# Patient Record
Sex: Male | Born: 1985 | Race: White | Hispanic: No | Marital: Married | Smoking: Never smoker
Health system: Southern US, Community
[De-identification: ages and names within clinical notes are randomized; demographics above are authoritative.]

---

## 2018-04-17 ENCOUNTER — Other Ambulatory Visit: Payer: Self-pay

## 2018-04-17 ENCOUNTER — Emergency Department (INDEPENDENT_AMBULATORY_CARE_PROVIDER_SITE_OTHER)
Admission: EM | Admit: 2018-04-17 | Discharge: 2018-04-17 | Disposition: A | Discharge status: Home / Self Care | Payer: 59 | Source: Home / Self Care

## 2018-04-17 ENCOUNTER — Encounter: Payer: Self-pay | Admitting: Emergency Medicine

## 2018-04-17 DIAGNOSIS — R69 Illness, unspecified: Secondary | ICD-10-CM

## 2018-04-17 DIAGNOSIS — J111 Influenza due to unidentified influenza virus with other respiratory manifestations: Secondary | ICD-10-CM

## 2018-04-17 MED ORDER — OSELTAMIVIR PHOSPHATE 75 MG PO CAPS: 75.0000 mg | ORAL_CAPSULE | Freq: Two times a day (BID) | ORAL | 0 refills | Status: AC

## 2018-04-17 NOTE — ED Triage Notes (Signed)
Here with flu like sx's- cough, fever, headache x2 days. Temp 100.5 last dose Tylenol @ 10am.

## 2018-04-17 NOTE — ED Provider Notes (Signed)
[facility]    CSN: [id number] Arrival date & time: 04/17/18  1531     History   Chief Complaint Chief Complaint  Patient presents with  . Cough  . Fever    HPI William Krause is a 33 y.o. male.   HPI  Joao Kingman is a 33 y.o. male presenting to UC with c/o sudden onset body aches, chills, cough, congestion and fever Tmax 101*F last night.  He took Tylenol at 10AM this morning. Others at home have also been sick. He did not receive the flu vaccine. Denies chest pain or SOB. No n/v/d.     History reviewed. No pertinent past medical history.  There are no active problems to display for this patient.   History reviewed. No pertinent surgical history.     Home Medications    Prior to Admission medications   Medication Sig Start Date End Date Taking? Authorizing Provider  oseltamivir (TAMIFLU) 75 MG capsule Take 1 capsule (75 mg total) by mouth every 12 (twelve) hours. 04/17/18   Lurene Shadow, PA-C    Family History History reviewed. No pertinent family history.  Social History Social History   Tobacco Use  . Smoking status: Never Smoker  . Smokeless tobacco: Never Used  Substance Use Topics  . Alcohol use: Yes  . Drug use: Never     Allergies   Patient has no known allergies.   Review of Systems Review of Systems  Constitutional: Positive for chills, fatigue and fever.  HENT: Positive for congestion. Negative for ear pain, sore throat, trouble swallowing and voice change.   Respiratory: Positive for cough. Negative for shortness of breath.   Cardiovascular: Negative for chest pain and palpitations.  Gastrointestinal: Negative for abdominal pain, diarrhea, nausea and vomiting.  Musculoskeletal: Positive for arthralgias, back pain and myalgias.  Skin: Negative for rash.  Neurological: Positive for headaches. Negative for dizziness and light-headedness.     Physical Exam Triage Vital Signs ED Triage Vitals  Enc Vitals Group   BP 04/17/18 1643 137/76     Pulse Rate 04/17/18 1643 (!) 113     Resp --      Temp 04/17/18 1643 (!) 100.5 F (38.1 C)     Temp Source 04/17/18 1643 Oral     SpO2 04/17/18 1643 97 %     Weight 04/17/18 1644 241 lb 6.4 oz (109.5 kg)     Height 04/17/18 1644 5\' 10"  (1.778 m)     Head Circumference --      Peak Flow --      Pain Score 04/17/18 1644 6     Pain Loc --      Pain Edu? --      Excl. in GC? --    No data found.  Updated Vital Signs BP 137/76 (BP Location: Left Arm)   Pulse (!) 113   Temp (!) 100.5 F (38.1 C) (Oral)   Ht 5\' 10"  (1.778 m)   Wt 241 lb 6.4 oz (109.5 kg)   SpO2 97%   BMI 34.64 kg/m   Visual Acuity Right Eye Distance:   Left Eye Distance:   Bilateral Distance:    Right Eye Near:   Left Eye Near:    Bilateral Near:     Physical Exam Vitals signs and nursing note reviewed.  Constitutional:      Appearance: Normal appearance. He is well-developed.  HENT:     Head: Normocephalic and atraumatic.     Right Ear: Tympanic membrane  normal.     Left Ear: Tympanic membrane normal.     Nose: Nose normal.     Mouth/Throat:     Lips: Pink.     Mouth: Mucous membranes are moist.     Pharynx: Oropharynx is clear. Uvula midline.  Neck:     Musculoskeletal: Normal range of motion.  Cardiovascular:     Rate and Rhythm: Normal rate and regular rhythm.  Pulmonary:     Effort: Pulmonary effort is normal.     Breath sounds: Normal breath sounds. No stridor. No wheezing or rhonchi.  Musculoskeletal: Normal range of motion.  Skin:    General: Skin is warm and dry.  Neurological:     Mental Status: He is alert and oriented to person, place, and time.  Psychiatric:        Behavior: Behavior normal.      UC Treatments / Results  Labs (all labs ordered are listed, but only abnormal results are displayed) Labs Reviewed - No data to display  EKG None  Radiology No results found.  Procedures Procedures (including critical care time)  Medications  Ordered in UC Medications - No data to display  Initial Impression / Assessment and Plan / UC Course  I have reviewed the triage vital signs and the nursing notes.  Pertinent labs & imaging results that were available during my care of the patient were reviewed by me and considered in my medical decision making (see chart for details).     Hx and exam c/w influenza Given active flu season, will tx empirically for influenza Home care info provided  Final Clinical Impressions(s) / UC Diagnoses   Final diagnoses:  Influenza-like illness     Discharge Instructions      You may take 500mg  acetaminophen every 4-6 hours or in combination with ibuprofen 400-600mg  every 6-8 hours as needed for pain, inflammation, and fever.  Be sure to well hydrated with clear liquids and get at least 8 hours of sleep at night, preferably more while sick.   Please follow up with family medicine in 1 week if needed.     ED Prescriptions    Medication Sig Dispense Auth. Provider   oseltamivir (TAMIFLU) 75 MG capsule Take 1 capsule (75 mg total) by mouth every 12 (twelve) hours. 10 capsule Lurene Shadow, PA-C     Controlled Substance Prescriptions [place] Controlled Substance Registry consulted? Not Applicable   Rolla Plate 04/17/18 1706

## 2018-04-17 NOTE — Discharge Instructions (Signed)
  You may take 500mg acetaminophen every 4-6 hours or in combination with ibuprofen 400-600mg every 6-8 hours as needed for pain, inflammation, and fever.  Be sure to well hydrated with clear liquids and get at least 8 hours of sleep at night, preferably more while sick.   Please follow up with family medicine in 1 week if needed.   

## 2018-06-02 ENCOUNTER — Emergency Department (INDEPENDENT_AMBULATORY_CARE_PROVIDER_SITE_OTHER): Payer: 59

## 2018-06-02 ENCOUNTER — Emergency Department (INDEPENDENT_AMBULATORY_CARE_PROVIDER_SITE_OTHER)
Admission: EM | Admit: 2018-06-02 | Discharge: 2018-06-02 | Disposition: A | Discharge status: Home / Self Care | Payer: 59 | Source: Home / Self Care

## 2018-06-02 ENCOUNTER — Other Ambulatory Visit: Payer: Self-pay

## 2018-06-02 DIAGNOSIS — R05 Cough: Secondary | ICD-10-CM | POA: Diagnosis not present

## 2018-06-02 DIAGNOSIS — R059 Cough, unspecified: Secondary | ICD-10-CM

## 2018-06-02 MED ORDER — CETIRIZINE HCL 10 MG PO TABS: 10.0000 mg | ORAL_TABLET | Freq: Every day | ORAL | 0 refills | Status: AC

## 2018-06-02 MED ORDER — FLUTICASONE PROPIONATE 50 MCG/ACT NA SUSP: 2.0000 | Freq: Every day | NASAL | 2 refills | Status: AC

## 2018-06-02 NOTE — Discharge Instructions (Signed)
°  Please start using the allergy medication today and allow up to 2 weeks before you may see full results, especially with the nasal spray.  If still having cough in 1-2 weeks, please follow up with your family provider for recheck of symptoms.  If you develop chest tightness, difficulty breathing, vomiting, fever over 100.4*F or other new concerning symptoms, please be reevaluated sooner.

## 2018-06-02 NOTE — ED Provider Notes (Signed)
[facility]    CSN: [id number] Arrival date & time: 06/02/18  1010     History   Chief Complaint Chief Complaint  Patient presents with  . Cough  . Fever    HPI William Krause is a 33 y.o. male.   HPI  William Krause is a 33 y.o. male presenting to UC with c/o intermittent dry cough since being dx with the flu end of January. He was tx with tamiflu. Cough worse over the last 3 days. Worse after coming back from lunch and going to work.  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 Denies fever, chills, chest pain or SOB.    History reviewed. No pertinent past medical history.  There are no active problems to display for this patient.   History reviewed. No pertinent surgical history.     Home Medications    Prior to Admission medications   Medication Sig Start Date End Date Taking? Authorizing Provider  cetirizine (ZYRTEC) 10 MG tablet Take 1 tablet (10 mg total) by mouth daily. 06/02/18   Lurene Shadow, PA-C  fluticasone (FLONASE) 50 MCG/ACT nasal spray Place 2 sprays into both nostrils daily. 06/02/18   Lurene Shadow, PA-C  oseltamivir (TAMIFLU) 75 MG capsule Take 1 capsule (75 mg total) by mouth every 12 (twelve) hours. 04/17/18   Lurene Shadow, PA-C    Family History History reviewed. No pertinent family history.  Social History Social History   Tobacco Use  . Smoking status: Never Smoker  . Smokeless tobacco: Never Used  Substance Use Topics  . Alcohol use: Yes  . Drug use: Never     Allergies   Patient has no known allergies.   Review of Systems Review of Systems  Constitutional: Negative for chills and fever.  HENT: Positive for congestion and postnasal drip. Negative for ear pain, sore throat, trouble swallowing and voice change.   Respiratory: Positive for cough. Negative for shortness of breath.   Cardiovascular: Negative for chest  pain and palpitations.  Gastrointestinal: Negative for abdominal pain, diarrhea, nausea and vomiting.  Musculoskeletal: Negative for arthralgias, back pain and myalgias.  Skin: Negative for rash.     Physical Exam Triage Vital Signs ED Triage Vitals [06/02/18 1029]  Enc Vitals Group     BP 131/88     Pulse Rate 88     Resp 18     Temp 98.4 F (36.9 C)     Temp Source Oral     SpO2 96 %     Weight      Height      Head Circumference      Peak Flow      Pain Score      Pain Loc      Pain Edu?      Excl. in GC?    No data found.  Updated Vital Signs BP 131/88 (BP Location: Right Arm)   Pulse 88   Temp 98.4 F (36.9 C) (Oral)   Resp 18   Ht 5\' 10"  (1.778 m)   Wt 235 lb (106.6 kg)   SpO2 96%   BMI 33.72 kg/m   Visual Acuity Right Eye Distance:   Left Eye Distance:   Bilateral Distance:    Right Eye Near:   Left Eye Near:    Bilateral Near:     Physical Exam  Vitals signs and nursing note reviewed.  Constitutional:      Appearance: Normal appearance. He is well-developed.  HENT:     Head: Normocephalic and atraumatic.     Right Ear: Tympanic membrane normal.     Left Ear: Tympanic membrane normal.     Nose: Nose normal.     Right Sinus: No maxillary sinus tenderness or frontal sinus tenderness.     Left Sinus: No maxillary sinus tenderness or frontal sinus tenderness.     Mouth/Throat:     Lips: Pink.     Mouth: Mucous membranes are moist.     Pharynx: Oropharynx is clear. Uvula midline.  Neck:     Musculoskeletal: Normal range of motion.  Cardiovascular:     Rate and Rhythm: Normal rate and regular rhythm.  Pulmonary:     Effort: Pulmonary effort is normal. No respiratory distress.     Breath sounds: Normal breath sounds. No stridor. No wheezing or rhonchi.  Musculoskeletal: Normal range of motion.  Skin:    General: Skin is warm and dry.  Neurological:     Mental Status: He is alert and oriented to person, place, and time.  Psychiatric:         Behavior: Behavior normal.      UC Treatments / Results  Labs (all labs ordered are listed, but only abnormal results are displayed) Labs Reviewed - No data to display  EKG None  Radiology Dg Chest 2 View  Result Date: 06/02/2018 CLINICAL DATA:  Cough EXAM: CHEST - 2 VIEW COMPARISON:  None. FINDINGS: There is no appreciable edema or consolidation. The heart size and pulmonary vascularity are normal. No adenopathy. No appreciable bone lesions. IMPRESSION: No edema or consolidation. Electronically Signed   By: Bretta Bang III M.D.   On: 06/02/2018 10:49    Procedures Procedures (including critical care time)  Medications Ordered in UC Medications - No data to display  Initial Impression / Assessment and Plan / UC Course  I have reviewed the triage vital signs and the nursing notes.  Pertinent labs & imaging results that were available during my care of the patient were reviewed by me and considered in my medical decision making (see chart for details).     Reassured pt of normal CXR O2 Sat 96% on RA No respiratory distress Question if pt allergic to something at work as cough happens same time each day Recommend triage of cetirizine and flonase   Final Clinical Impressions(s) / UC Diagnoses   Final diagnoses:  Cough     Discharge Instructions      Please start using the allergy medication today and allow up to 2 weeks before you may see full results, especially with the nasal spray.  If still having cough in 1-2 weeks, please follow up with your family provider for recheck of symptoms.  If you develop chest tightness, difficulty breathing, vomiting, fever over 100.4*F or other new concerning symptoms, please be reevaluated sooner.        ED Prescriptions    Medication Sig Dispense Auth. Provider   fluticasone (FLONASE) 50 MCG/ACT nasal spray Place 2 sprays into both nostrils daily. 16 g Waylan Rocher O, PA-C   cetirizine (ZYRTEC) 10 MG tablet Take 1  tablet (10 mg total) by mouth daily. 30 tablet Lurene Shadow, PA-C     Controlled Substance Prescriptions  Controlled Substance Registry consulted? Not Applicable   Rolla Plate 06/02/18 2008

## 2018-06-02 NOTE — ED Triage Notes (Signed)
Pt c/o intermittent cough that hasn't gone away since he got the flu end of Jan. Pt also says he had a fever last night. No OTC meds taken.

## 2020-06-11 IMAGING — DX CHEST - 2 VIEW
2 series · 2 of 2 positions shown · non-contrast
Comparison: None.

CLINICAL DATA: Cough

EXAM:
CHEST - 2 VIEW

[chest pa]
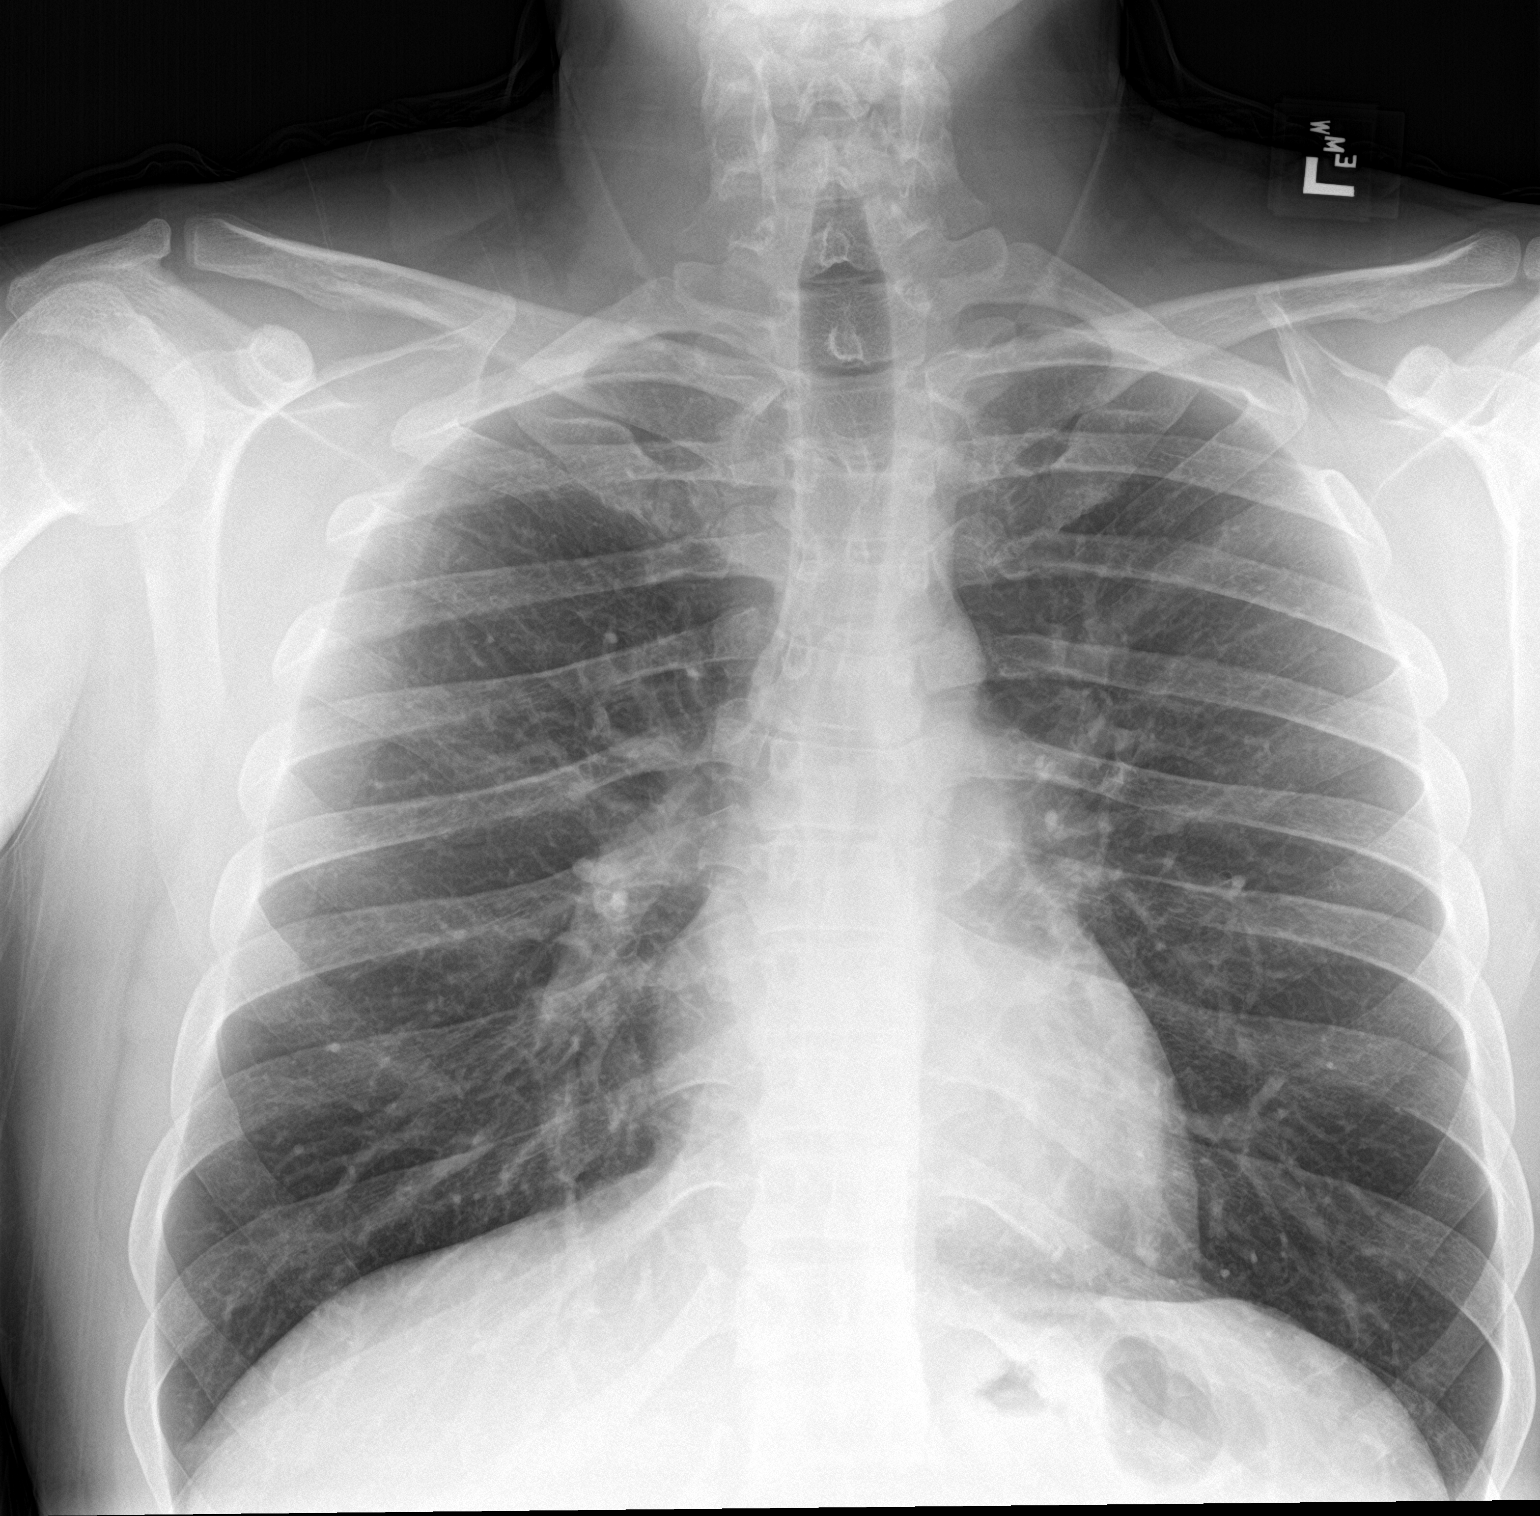

[chest lat]
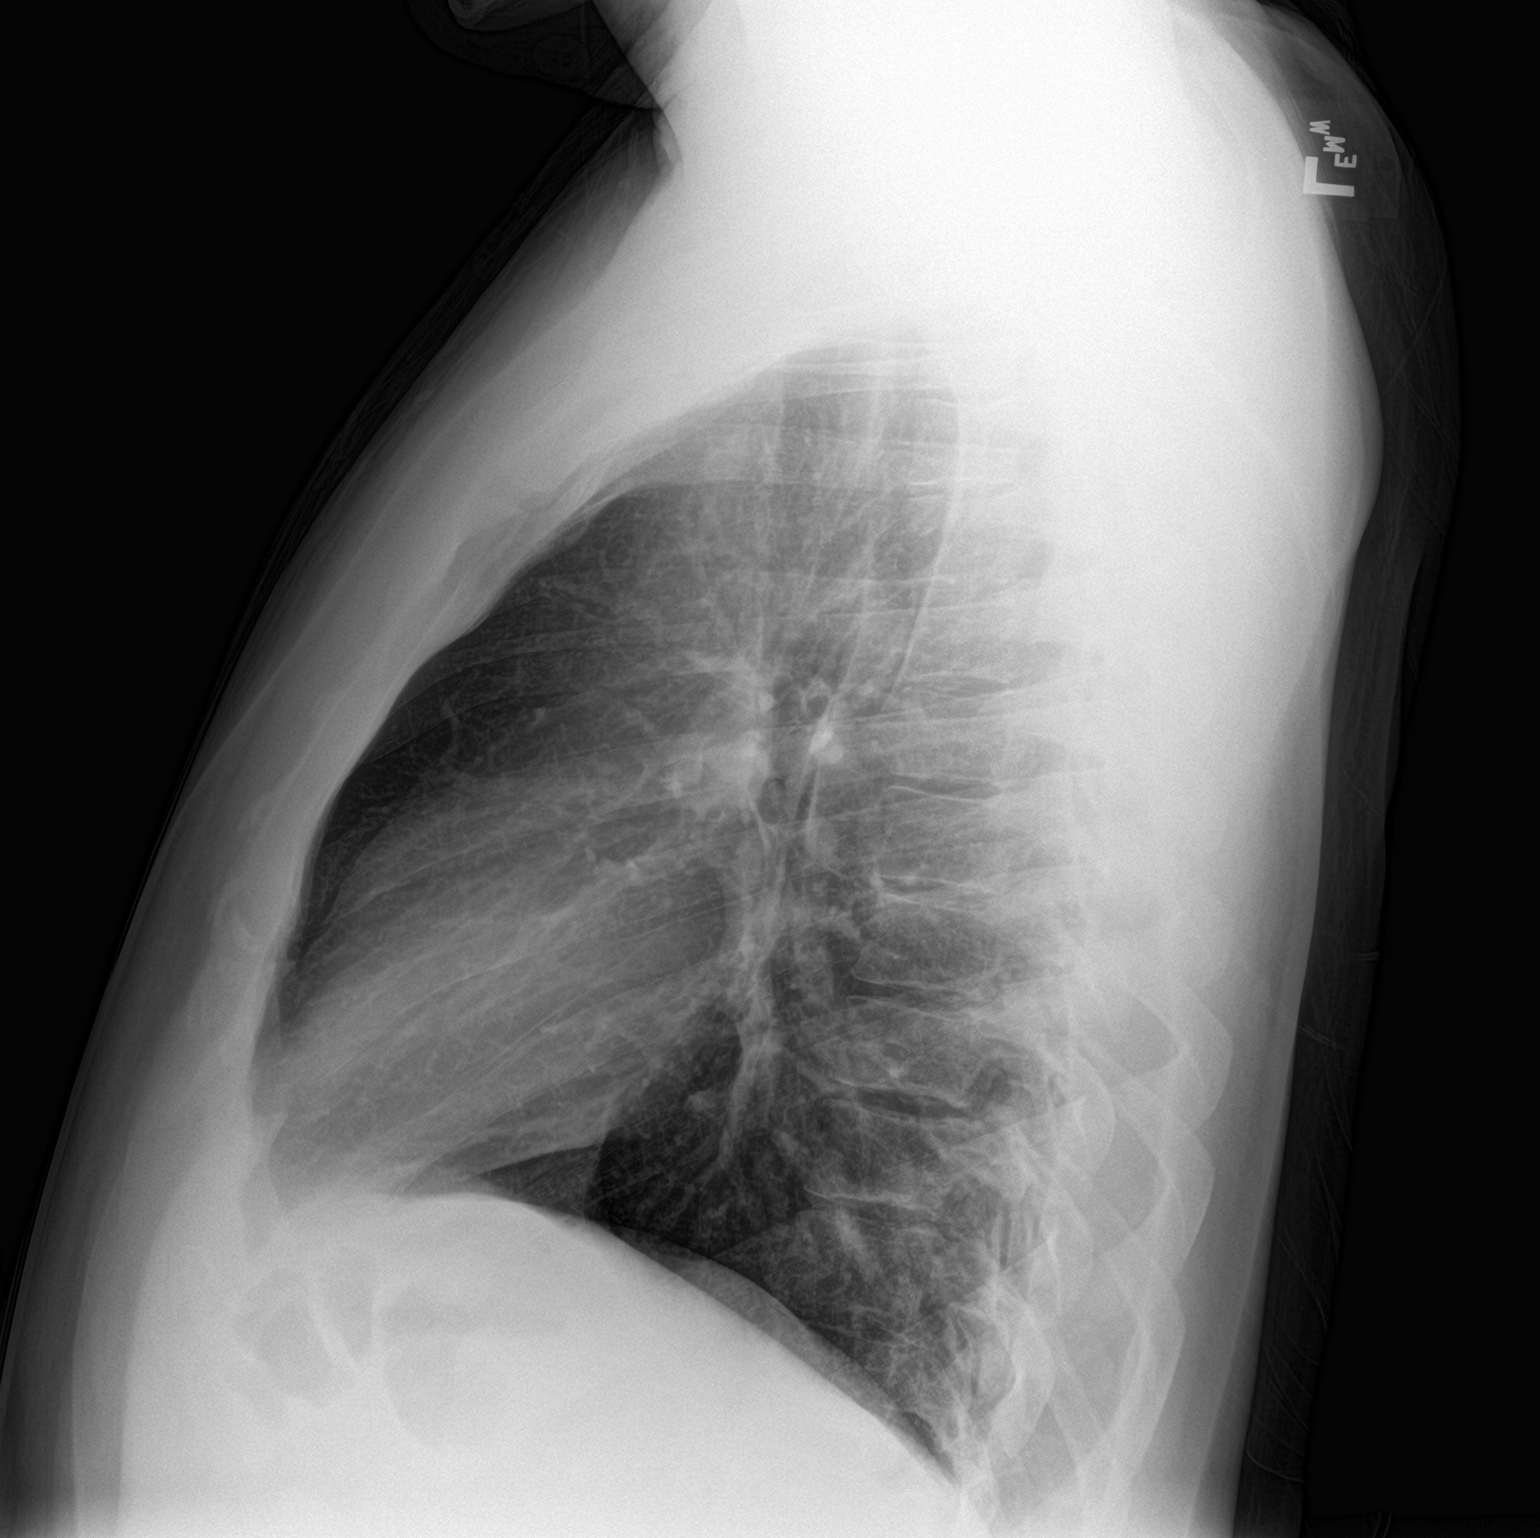

[2 of 2 positions shown; findings below may reference images not displayed]

FINDINGS: There is no appreciable edema or consolidation. The heart size and
pulmonary vascularity are normal. No adenopathy. No appreciable bone
lesions.
IMPRESSION: No edema or consolidation.
